# Patient Record
Sex: Male | Born: 1990 | Race: Black or African American | Hispanic: No | Marital: Single | Smoking: Current every day smoker
Health system: Southern US, Community
[De-identification: ages and names within clinical notes are randomized; demographics above are authoritative.]

---

## 2012-06-17 ENCOUNTER — Emergency Department (HOSPITAL_COMMUNITY)
Admission: EM | Admit: 2012-06-17 | Discharge: 2012-06-18 | Disposition: A | Discharge status: Home / Self Care | Payer: Self-pay | Attending: Emergency Medicine | Admitting: Emergency Medicine

## 2012-06-17 ENCOUNTER — Emergency Department (HOSPITAL_COMMUNITY): Payer: Self-pay

## 2012-06-17 ENCOUNTER — Emergency Department (HOSPITAL_COMMUNITY)
Admission: EM | Admit: 2012-06-17 | Discharge: 2012-06-17 | Disposition: A | Discharge status: Home / Self Care | Payer: Self-pay | Attending: Emergency Medicine | Admitting: Emergency Medicine

## 2012-06-17 ENCOUNTER — Encounter (HOSPITAL_COMMUNITY): Payer: Self-pay | Admitting: Adult Health

## 2012-06-17 DIAGNOSIS — N451 Epididymitis: Secondary | ICD-10-CM

## 2012-06-17 DIAGNOSIS — F1210 Cannabis abuse, uncomplicated: Secondary | ICD-10-CM | POA: Insufficient documentation

## 2012-06-17 DIAGNOSIS — R369 Urethral discharge, unspecified: Secondary | ICD-10-CM | POA: Insufficient documentation

## 2012-06-17 DIAGNOSIS — N50812 Left testicular pain: Secondary | ICD-10-CM

## 2012-06-17 DIAGNOSIS — Z7251 High risk heterosexual behavior: Secondary | ICD-10-CM | POA: Insufficient documentation

## 2012-06-17 DIAGNOSIS — N509 Disorder of male genital organs, unspecified: Secondary | ICD-10-CM | POA: Insufficient documentation

## 2012-06-17 DIAGNOSIS — N453 Epididymo-orchitis: Secondary | ICD-10-CM | POA: Insufficient documentation

## 2012-06-17 DIAGNOSIS — R350 Frequency of micturition: Secondary | ICD-10-CM | POA: Insufficient documentation

## 2012-06-17 LAB — CBC WITH DIFFERENTIAL/PLATELET
Basophils Relative: 0 % (ref 0–1)
Eosinophils Absolute: 0 10*3/uL (ref 0.0–0.7)
Eosinophils Relative: 1 % (ref 0–5)
HCT: 40.9 % (ref 39.0–52.0)
Hemoglobin: 14.1 g/dL (ref 13.0–17.0)
MCH: 28.9 pg (ref 26.0–34.0)
MCHC: 34.5 g/dL (ref 30.0–36.0)
MCV: 83.8 fL (ref 78.0–100.0)
Monocytes Absolute: 0.4 10*3/uL (ref 0.1–1.0)
Monocytes Relative: 6 % (ref 3–12)

## 2012-06-17 LAB — URINALYSIS, ROUTINE W REFLEX MICROSCOPIC
Bilirubin Urine: NEGATIVE
Glucose, UA: NEGATIVE mg/dL
Ketones, ur: 15 mg/dL — AB
pH: 6 (ref 5.0–8.0)

## 2012-06-17 LAB — COMPREHENSIVE METABOLIC PANEL
Albumin: 4.6 g/dL (ref 3.5–5.2)
BUN: 13 mg/dL (ref 6–23)
Calcium: 9.4 mg/dL (ref 8.4–10.5)
Chloride: 99 mEq/L (ref 96–112)
Creatinine, Ser: 1.11 mg/dL (ref 0.50–1.35)
Total Bilirubin: 0.3 mg/dL (ref 0.3–1.2)
Total Protein: 7.9 g/dL (ref 6.0–8.3)

## 2012-06-17 MED ORDER — ONDANSETRON HCL 4 MG/2ML IJ SOLN
4.0000 mg | Freq: Once | INTRAMUSCULAR | Status: AC
  Administered 2012-06-17: 4 mg via INTRAVENOUS
  Filled 2012-06-17: qty 2

## 2012-06-17 MED ORDER — HYDROCODONE-ACETAMINOPHEN 5-325 MG PO TABS: 2.0000 | ORAL_TABLET | ORAL | Status: AC | PRN

## 2012-06-17 MED ORDER — HYDROMORPHONE HCL PF 1 MG/ML IJ SOLN
1.0000 mg | Freq: Once | INTRAMUSCULAR | Status: DC
  Filled 2012-06-17: qty 1

## 2012-06-17 MED ORDER — ONDANSETRON 4 MG PO TBDP: 4.0000 mg | ORAL_TABLET | Freq: Once | ORAL | Status: DC

## 2012-06-17 MED ORDER — DEXTROSE 5 % IV SOLN
250.0000 mg | Freq: Once | INTRAVENOUS | Status: AC
  Administered 2012-06-17: 250 mg via INTRAVENOUS
  Filled 2012-06-17: qty 250

## 2012-06-17 MED ORDER — ONDANSETRON HCL 4 MG/2ML IJ SOLN: 4.0000 mg | Freq: Once | INTRAMUSCULAR | Status: DC

## 2012-06-17 MED ORDER — HYDROMORPHONE HCL PF 1 MG/ML IJ SOLN
1.0000 mg | Freq: Once | INTRAMUSCULAR | Status: AC
  Administered 2012-06-17: 1 mg via INTRAMUSCULAR

## 2012-06-17 MED ORDER — DOXYCYCLINE HYCLATE 100 MG PO CAPS: 100.0000 mg | ORAL_CAPSULE | Freq: Two times a day (BID) | ORAL | Status: AC

## 2012-06-17 NOTE — Progress Notes (Signed)
Patient ID: Paul Nguyen, male   DOB: 01-Dec-1990, 22 y.o.   MRN: [id number]   Mr. Cotroneo called me tonight. Evidently he was given our number for follow-up. He still has pain in the testicle and was wondering if he could have a "twist". I told him certainly intermittent torsion is a concern and I recommended he go to the ER for repeat scrotal US with doppler.

## 2012-06-17 NOTE — ED Provider Notes (Signed)
History     CSN: [id number]  Arrival date & time 06/17/12  0058   First MD Initiated Contact with Patient 06/17/12 0116      Chief Complaint  Patient presents with  . Testicle Pain    (Consider location/radiation/quality/duration/timing/severity/associated sxs/prior treatment) HPI Pt presents with L testicular pain 2 hours prior to presentation. He admits to unprotected sex and and penile d/c. Testicular pain is worse when standing. States he looked up testicular pain and is concerned for torsion. No previously similar symptoms. No fever, chills, flank pain. No trauma History reviewed. No pertinent past medical history.  History reviewed. No pertinent past surgical history.  History reviewed. No pertinent family history.  History  Substance Use Topics  . Smoking status: Never Smoker   . Smokeless tobacco: Not on file  . Alcohol Use: No      Review of Systems  Constitutional: Negative for fever and chills.  Gastrointestinal: Negative for nausea, vomiting and abdominal pain.  Genitourinary: Positive for frequency, discharge and testicular pain. Negative for dysuria, hematuria, flank pain, scrotal swelling and penile pain.  Musculoskeletal: Negative for back pain.  Skin: Negative for rash and wound.  Neurological: Negative for weakness and numbness.  All other systems reviewed and are negative.    Allergies  Review of patient's allergies indicates no known allergies.  Home Medications   Current Outpatient Rx  Name  Route  Sig  Dispense  Refill  . doxycycline (VIBRAMYCIN) 100 MG capsule   Oral   Take 1 capsule (100 mg total) by mouth 2 (two) times daily. One po bid x 7 days   14 capsule   0   . HYDROcodone-acetaminophen (NORCO) 5-325 MG per tablet   Oral   Take 2 tablets by mouth every 4 (four) hours as needed for pain.   10 tablet   0     BP 112/59  Pulse 79  Temp(Src) 98.9 F (37.2 C) (Oral)  Resp 16  SpO2 99%  Physical Exam  Nursing note and  vitals reviewed. Constitutional: He is oriented to person, place, and time. He appears well-developed and well-nourished.  Intermittently writhing on bed.   HENT:  Head: Normocephalic and atraumatic.  Mouth/Throat: Oropharynx is clear and moist.  Eyes: EOM are normal. Pupils are equal, round, and reactive to light.  Neck: Normal range of motion. Neck supple.  Cardiovascular: Normal rate and regular rhythm.   Pulmonary/Chest: Effort normal and breath sounds normal. No respiratory distress. He has no wheezes. He has no rales.  Abdominal: Soft. Bowel sounds are normal. He exhibits no distension and no mass. There is no tenderness. There is no rebound and no guarding.  Genitourinary: Penis normal. No penile tenderness.  bl testes in normal lie. +cremasteric reflex bl. Mild L testicular tenderness. No lymphadenopathy, hernia, penile d/c  Musculoskeletal: Normal range of motion. He exhibits no edema and no tenderness.  Neurological: He is alert and oriented to person, place, and time.  Skin: Skin is warm and dry. No rash noted. No erythema.  Psychiatric: He has a normal mood and affect. His behavior is normal.    ED Course  Procedures (including critical care time)  Labs Reviewed  URINALYSIS, ROUTINE W REFLEX MICROSCOPIC - Abnormal; Notable for the following:    Specific Gravity, Urine 1.034 (*)    Ketones, ur 15 (*)    All other components within normal limits  COMPREHENSIVE METABOLIC PANEL - Abnormal; Notable for the following:    Glucose, Bld 125 (*)  All other components within normal limits  GC/CHLAMYDIA PROBE AMP  CBC WITH DIFFERENTIAL   US Scrotum  06/17/2012  *RADIOLOGY REPORT*  Clinical Data: Left testicular pain  ULTRASOUND OF SCROTUM  Technique:  Complete ultrasound examination of the testicles, epididymis, and other scrotal structures was performed.  Comparison:  None.  Findings:  Right testis:  Measures 4.7 x 2.2 x 2.8 cm.  Normal echogenicity.  Left testis:  Measures 4.7 x  2.1 x 2.5 cm.  Normal echogenicity.  Right epididymis:  Normal in size and appearance.  Left epididymis:  Normal in size and mildly heterogeneous echogenicity.  Hydrocele:  Absent  Varicocele:  Absent  Color Doppler flow with arterial and venous wave forms documented bilaterally.  IMPRESSION: Normal sonographic appearance to the testicles.  Color Doppler flow with arterial and venous wave forms documented bilaterally.  Nonspecific mild heterogeneous echogenicity to the left epididymis without hypervascularity.  May reflect a mild or sequelae of prior epididymitis.   Original Report Authenticated By: Jearld Lesch, M.D.    [place] Art/ven Flow Abd Pelv Doppler  06/17/2012  *RADIOLOGY REPORT*  Clinical Data: Left testicular pain  ULTRASOUND OF SCROTUM  Technique:  Complete ultrasound examination of the testicles, epididymis, and other scrotal structures was performed.  Comparison:  None.  Findings:  Right testis:  Measures 4.7 x 2.2 x 2.8 cm.  Normal echogenicity.  Left testis:  Measures 4.7 x 2.1 x 2.5 cm.  Normal echogenicity.  Right epididymis:  Normal in size and appearance.  Left epididymis:  Normal in size and mildly heterogeneous echogenicity.  Hydrocele:  Absent  Varicocele:  Absent  Color Doppler flow with arterial and venous wave forms documented bilaterally.  IMPRESSION: Normal sonographic appearance to the testicles.  Color Doppler flow with arterial and venous wave forms documented bilaterally.  Nonspecific mild heterogeneous echogenicity to the left epididymis without hypervascularity.  May reflect a mild or sequelae of prior epididymitis.   Original Report Authenticated By: Jearld Lesch, M.D.      1. Testicular pain, left       MDM  Will treat for epididymitis. Encourage to f/u with urology and return for worsening symptoms.         Loren Racer, MD 06/17/12 [phone]

## 2012-06-17 NOTE — ED Notes (Signed)
Presents with sudden onset of  Left sided testicle pain while sitting in car, pain radiates into left thigh and into lower left abdomen. Pt reports sexual intercourse yesterday without a condom and urination more frequently over the past 2 weeks. Pain is described as "hot and heat" associated with discharge.

## 2012-06-17 NOTE — ED Notes (Signed)
Pt seen here yesterday for same symptoms, c/o of worsening pain.

## 2012-06-17 NOTE — ED Notes (Signed)
Pt. Up to nurses desk crying stating "the pain is getting worse".

## 2012-06-18 LAB — GC/CHLAMYDIA PROBE AMP
CT Probe RNA: NEGATIVE
GC Probe RNA: NEGATIVE

## 2012-06-18 MED ORDER — NAPROXEN 250 MG PO TABS
500.0000 mg | ORAL_TABLET | Freq: Once | ORAL | Status: AC
  Administered 2012-06-18: 500 mg via ORAL
  Filled 2012-06-18: qty 2

## 2012-06-18 MED ORDER — NAPROXEN 375 MG PO TABS: 375.0000 mg | ORAL_TABLET | Freq: Two times a day (BID) | ORAL | Status: DC

## 2012-06-18 NOTE — ED Provider Notes (Signed)
Medical screening examination/treatment/procedure(s) were performed by non-physician practitioner and as supervising physician I was immediately available for consultation/collaboration.  Ethelda Chick, MD 06/18/12 [phone]

## 2012-06-18 NOTE — Discharge Instructions (Signed)
Epididymitis Epididymitis is a swelling (inflammation) of the epididymis. The epididymis is a cord-like structure along the back part of the testicle. Epididymitis is usually, but not always, caused by infection. This is usually a sudden problem beginning with chills, fever and pain behind the scrotum and in the testicle. There may be swelling and redness of the testicle. DIAGNOSIS  Physical examination will reveal a tender, swollen epididymis. Sometimes, cultures are obtained from the urine or from prostate secretions to help find out if there is an infection or if the cause is a different problem. Sometimes, blood work is performed to see if your white blood cell count is elevated and if a germ (bacterial) or viral infection is present. Using this knowledge, an appropriate medicine which kills germs (antibiotic) can be chosen by your caregiver. A viral infection causing epididymitis will most often go away (resolve) without treatment. HOME CARE INSTRUCTIONS   Hot sitz baths for 20 minutes, 4 times per day, may help relieve pain.  Only take over-the-counter or prescription medicines for pain, discomfort or fever as directed by your caregiver.  Take all medicines, including antibiotics, as directed. Take the antibiotics for the full prescribed length of time even if you are feeling better.  It is very important to keep all follow-up appointments. SEEK IMMEDIATE MEDICAL CARE IF:   You have a fever.  You have pain not relieved with medicines.  You have any worsening of your problems.  Your pain seems to come and go.  You develop pain, redness, and swelling in the scrotum and surrounding areas. MAKE SURE YOU:   Understand these instructions.  Will watch your condition.  Will get help right away if you are not doing well or get worse. Document Released: 04/01/2000 Document Revised: 06/27/2011 Document Reviewed: 02/19/2009 [employer] Patient Information 2013 [employer].  

## 2012-06-18 NOTE — ED Provider Notes (Signed)
History     CSN: [id number]  Arrival date & time 06/17/12  2118   First MD Initiated Contact with Patient 06/17/12 2352      Chief Complaint  Patient presents with  . Groin Pain    (Consider location/radiation/quality/duration/timing/severity/associated sxs/prior treatment) HPI  22 year old male presents complaining of testicle pain. Patient reports acute onset of pain to his left testicle yesterday while sitting in the car. Describe pain as a sharp and throbbing sensation worsening with certain position. He also noticed swelling to his left scrotum. Pain is worse when standing. He denies fever chills, back pain, flank pain, abdominal pain. He denies nausea, vomiting, diarrhea. He denies any rash. He denies pain with sexual activities. He was seen in the ED yesterday for the same complaint. At which time a scrotal ultrasound was performed demonstrating no evidence of testicular torsion. There is some mild evidence suggestive of epididymitis. No evidence of urinary tract infection based on UA. Patient was discharge with antibiotic, and Vicodin for pain. Patient reports he has been taking the pain medication but pain has not resolved. He did call urologist, Dr. Carolin Sicks, who recommend patient to take and nonsteroidal anti-inflammatory medication for this pain. He decided to come to the ER instead.  No past medical history on file.  No past surgical history on file.  No family history on file.  History  Substance Use Topics  . Smoking status: Never Smoker   . Smokeless tobacco: Not on file  . Alcohol Use: No      Review of Systems  Constitutional: Negative for fever.  Genitourinary: Positive for scrotal swelling and testicular pain. Negative for discharge, penile swelling and penile pain.  Skin: Negative for rash.  Neurological: Negative for numbness.    Allergies  Review of patient's allergies indicates no known allergies.  Home Medications   Current Outpatient Rx  Name   Route  Sig  Dispense  Refill  . doxycycline (VIBRAMYCIN) 100 MG capsule   Oral   Take 1 capsule (100 mg total) by mouth 2 (two) times daily. One po bid x 7 days   14 capsule   0   . HYDROcodone-acetaminophen (NORCO) 5-325 MG per tablet   Oral   Take 2 tablets by mouth every 4 (four) hours as needed for pain.   10 tablet   0     BP 118/79  Pulse 67  Temp(Src) 98.2 F (36.8 C) (Oral)  Resp 18  SpO2 100%  Physical Exam  Nursing note and vitals reviewed. Constitutional: He appears well-developed and well-nourished. No distress.  HENT:  Head: Atraumatic.  Eyes: Conjunctivae are normal.  Neck: Neck supple.  Abdominal: Soft. There is no tenderness.  No CVA tenderness.  Genitourinary: Penis normal. No penile tenderness.  Mild tenderness to L testicle without overlying skin changes, or rash noted.  Normal lie.  +cremasteric reflex  Musculoskeletal: He exhibits no edema and no tenderness.  Neurological: He is alert.  Skin: Skin is warm. No rash noted.  Psychiatric: He has a normal mood and affect.    ED Course  Procedures (including critical care time)  Labs Reviewed - No data to display US Scrotum  06/17/2012  *RADIOLOGY REPORT*  Clinical Data: Left testicular pain  ULTRASOUND OF SCROTUM  Technique:  Complete ultrasound examination of the testicles, epididymis, and other scrotal structures was performed.  Comparison:  None.  Findings:  Right testis:  Measures 4.7 x 2.2 x 2.8 cm.  Normal echogenicity.  Left testis:  Measures  4.7 x 2.1 x 2.5 cm.  Normal echogenicity.  Right epididymis:  Normal in size and appearance.  Left epididymis:  Normal in size and mildly heterogeneous echogenicity.  Hydrocele:  Absent  Varicocele:  Absent  Color Doppler flow with arterial and venous wave forms documented bilaterally.  IMPRESSION: Normal sonographic appearance to the testicles.  Color Doppler flow with arterial and venous wave forms documented bilaterally.  Nonspecific mild heterogeneous  echogenicity to the left epididymis without hypervascularity.  May reflect a mild or sequelae of prior epididymitis.   Original Report Authenticated By: Jearld Lesch, M.D.    [place] Art/ven Flow Abd Pelv Doppler  06/17/2012  *RADIOLOGY REPORT*  Clinical Data: Left testicular pain  ULTRASOUND OF SCROTUM  Technique:  Complete ultrasound examination of the testicles, epididymis, and other scrotal structures was performed.  Comparison:  None.  Findings:  Right testis:  Measures 4.7 x 2.2 x 2.8 cm.  Normal echogenicity.  Left testis:  Measures 4.7 x 2.1 x 2.5 cm.  Normal echogenicity.  Right epididymis:  Normal in size and appearance.  Left epididymis:  Normal in size and mildly heterogeneous echogenicity.  Hydrocele:  Absent  Varicocele:  Absent  Color Doppler flow with arterial and venous wave forms documented bilaterally.  IMPRESSION: Normal sonographic appearance to the testicles.  Color Doppler flow with arterial and venous wave forms documented bilaterally.  Nonspecific mild heterogeneous echogenicity to the left epididymis without hypervascularity.  May reflect a mild or sequelae of prior epididymitis.   Original Report Authenticated By: Jearld Lesch, M.D.      No diagnosis found.  12:25 AM Pt was seen and evaluated by me for his testicle pain and recent diagnosis of epidydimitis.  He is here primarily requesting NSAIDs since he cannot tolerates vicodin.  Pain improves after receiving naproxen.  Pt stable for discharge.  Previous US shows no evidence concerning for testicular torsion or UTI.  Pt to f/u with urologist as needed.  All questions answered to pt's satisfaction.  Return precaution discussed.    I have reviewed nursing notes and vital signs. I personally reviewed the imaging tests through PACS system  I reviewed available ER/hospitalization records thought the EMR  BP 118/79  Pulse 67  Temp(Src) 98.2 F (36.8 C) (Oral)  Resp 18  SpO2 100%   1. epidydimitis  MDM           Fayrene Helper, PA-C 06/18/12 [id number]

## 2014-04-16 ENCOUNTER — Emergency Department (HOSPITAL_COMMUNITY)
Admission: EM | Admit: 2014-04-16 | Discharge: 2014-04-16 | Disposition: A | Discharge status: Home / Self Care | Payer: BC Managed Care – PPO | Attending: Emergency Medicine | Admitting: Emergency Medicine

## 2014-04-16 ENCOUNTER — Encounter (HOSPITAL_COMMUNITY): Payer: Self-pay | Admitting: Nurse Practitioner

## 2014-04-16 DIAGNOSIS — Z720 Tobacco use: Secondary | ICD-10-CM | POA: Insufficient documentation

## 2014-04-16 DIAGNOSIS — H9209 Otalgia, unspecified ear: Secondary | ICD-10-CM | POA: Insufficient documentation

## 2014-04-16 DIAGNOSIS — B349 Viral infection, unspecified: Secondary | ICD-10-CM | POA: Insufficient documentation

## 2014-04-16 DIAGNOSIS — Z79899 Other long term (current) drug therapy: Secondary | ICD-10-CM | POA: Diagnosis not present

## 2014-04-16 DIAGNOSIS — J029 Acute pharyngitis, unspecified: Secondary | ICD-10-CM | POA: Insufficient documentation

## 2014-04-16 LAB — RAPID STREP SCREEN (MED CTR MEBANE ONLY): STREPTOCOCCUS, GROUP A SCREEN (DIRECT): NEGATIVE

## 2014-04-16 MED ORDER — NAPROXEN 500 MG PO TABS: 500.0000 mg | ORAL_TABLET | Freq: Two times a day (BID) | ORAL | Status: AC

## 2014-04-16 NOTE — ED Notes (Signed)
Pt c/o of a sore throat for the last one week, denies fever or chills, or difficulty swallowing.

## 2014-04-16 NOTE — ED Provider Notes (Signed)
CSN: [id number]     Arrival date & time 04/16/14  2009 History  This chart was scribed for non-physician practitioner, Antony MaduraKelly Ily Denno, PA-C,working with Arby BarretteMarcy Pfeiffer, MD, by Karle PlumberJennifer Tensley, ED Scribe. This patient was seen in room WTR9/WTR9 and the patient's care was started at 9:12 PM.  Chief Complaint  Patient presents with  . Sore Throat   The history is provided by the patient. No language interpreter was used.    HPI Comments:  Paul Nguyen is a 23 y.o. male who presents to the Emergency Department complaining of unchanging, moderate right-sided throat pain that began approximately one week ago. He reports associated dry throat, cough that also started about one week ago, chest congestion and also mild right ear pain. States the pain is worse in the morning. He reports taking OTC Tylenol and drinking hot tea with significant relief. Denies worsening factors. Denies inability to swallow, fever, chills, nasal congestion, rhinorrhea, drooling, pus drainage in the mouth, nausea or vomiting.  History reviewed. No pertinent past medical history. History reviewed. No pertinent past surgical history. History reviewed. No pertinent family history. History  Substance Use Topics  . Smoking status: Current Every Day Smoker -- 3.00 packs/day    Types: Cigarettes  . Smokeless tobacco: Not on file  . Alcohol Use: No    Review of Systems  Constitutional: Negative for fever and chills.  HENT: Positive for congestion, ear pain and sore throat. Negative for drooling, rhinorrhea and trouble swallowing.   Respiratory: Positive for cough. Negative for shortness of breath.   Gastrointestinal: Negative for nausea and vomiting.    Allergies  Review of patient's allergies indicates no known allergies.  Home Medications   Prior to Admission medications   Medication Sig Start Date End Date Taking? Authorizing Provider  doxycycline (VIBRAMYCIN) 100 MG capsule Take 1 capsule (100 mg total) by mouth 2  (two) times daily. One po bid x 7 days 06/17/12   Loren Raceravid Yelverton, MD  HYDROcodone-acetaminophen [facility](NORCO) 5-325 MG per tablet Take 2 tablets by mouth every 4 (four) hours as needed for pain. 06/17/12   Loren Raceravid Yelverton, MD  naproxen (NAPROSYN) 500 MG tablet Take 1 tablet (500 mg total) by mouth 2 (two) times daily. 04/16/14   Antony MaduraKelly Arnett Duddy, PA-C   Triage Vitals: BP 127/84 mmHg  Pulse 73  Temp(Src) 98.6 F (37 C) (Oral)  Resp 18  SpO2 100%  Physical Exam  Constitutional: He is oriented to person, place, and time. He appears well-developed and well-nourished. No distress.  Nontoxic/nonseptic appearing  HENT:  Head: Normocephalic and atraumatic.  Right Ear: Tympanic membrane, external ear and ear canal normal.  Left Ear: Tympanic membrane, external ear and ear canal normal.  Mouth/Throat: Uvula is midline and mucous membranes are normal. No oral lesions. No trismus in the jaw. No uvula swelling. Posterior oropharyngeal edema (Very mild) and posterior oropharyngeal erythema present. No oropharyngeal exudate or tonsillar abscesses.  Patient tolerating secretions without difficulty and speaking in full sentences.  Eyes: Conjunctivae and EOM are normal. No scleral icterus.  Neck: Normal range of motion.  No nuchal rigidity or meningismus. No stridor.  Cardiovascular: Normal rate, regular rhythm and normal heart sounds.   Pulmonary/Chest: Effort normal and breath sounds normal. No respiratory distress. He has no wheezes. He has no rales.  Respirations even and unlabored  Musculoskeletal: Normal range of motion.  Lymphadenopathy:    He has cervical adenopathy (tender R anterior cervical).  Neurological: He is alert and oriented to person, place, and time. He  exhibits normal muscle tone. Coordination normal.  Skin: Skin is warm and dry. No rash noted. He is not diaphoretic. No erythema. No pallor.  Psychiatric: He has a normal mood and affect. His behavior is normal.  Nursing note and vitals  reviewed.   ED Course  Procedures (including critical care time) DIAGNOSTIC STUDIES: Oxygen Saturation is 100% on RA, normal by my interpretation.   COORDINATION OF CARE: 9:22 PM- Reassured pt that his symptoms were likely viral. Will treat symptomatically. Pt verbalizes understanding and agrees to plan.  Medications - No data to display  Labs Review Labs Reviewed  RAPID STREP SCREEN  CULTURE, GROUP A STREP   Imaging Review No results found.   EKG Interpretation None      MDM   Final diagnoses:  Viral pharyngitis  Viral illness    Pt afebrile without tonsillar exudate, negative strep. Presents with mild cervical lymphadenopathy, and dysphagia; diagnosis of viral pharyngitis. No abx indicated. Will discharge with symptomatic tx for pain  Pt does not appear dehydrated, but did discuss importance of water rehydration. Presentation not concerning for PTA or infxn spread to soft tissue. No trismus or uvula deviation. Specific return precautions discussed. Pt tolerating secretions without difficulty; airway intact. Recommended PCP follow up. Patient agreeable to plan with no unaddressed concerns.  I personally performed the services described in this documentation, which was scribed in my presence. The recorded information has been reviewed and is accurate.   Filed Vitals:   04/16/14 2028  BP: 127/84  Pulse: 73  Temp: 98.6 F (37 C)  TempSrc: Oral  Resp: 18  SpO2: 100%     Antony MaduraKelly Shereese Bonnie, PA-C 04/16/14 2225  Arby BarretteMarcy Pfeiffer, MD 04/17/14 [phone]

## 2014-04-16 NOTE — Discharge Instructions (Signed)
Viral Infections A viral infection can be caused by different types of viruses.Most viral infections are not serious and resolve on their own. However, some infections may cause severe symptoms and may lead to further complications. SYMPTOMS Viruses can frequently cause:  Minor sore throat.  Aches and pains.  Headaches.  Runny nose.  Different types of rashes.  Watery eyes.  Tiredness.  Cough.  Loss of appetite.  Gastrointestinal infections, resulting in nausea, vomiting, and diarrhea. These symptoms do not respond to antibiotics because the infection is not caused by bacteria. However, you might catch a bacterial infection following the viral infection. This is sometimes called a "superinfection." Symptoms of such a bacterial infection may include:  Worsening sore throat with pus and difficulty swallowing.  Swollen neck glands.  Chills and a high or persistent fever.  Severe headache.  Tenderness over the sinuses.  Persistent overall ill feeling (malaise), muscle aches, and tiredness (fatigue).  Persistent cough.  Yellow, green, or brown mucus production with coughing. HOME CARE INSTRUCTIONS   Only take over-the-counter or prescription medicines for pain, discomfort, diarrhea, or fever as directed by your caregiver.  Drink enough water and fluids to keep your urine clear or pale yellow. Sports drinks can provide valuable electrolytes, sugars, and hydration.  Get plenty of rest and maintain proper nutrition. Soups and broths with crackers or rice are fine. SEEK IMMEDIATE MEDICAL CARE IF:   You have severe headaches, shortness of breath, chest pain, neck pain, or an unusual rash.  You have uncontrolled vomiting, diarrhea, or you are unable to keep down fluids.  You or your child has an oral temperature above 102 F (38.9 C), not controlled by medicine.  Your baby is older than 3 months with a rectal temperature of 102 F (38.9 C) or higher.  Your baby is 463  months old or younger with a rectal temperature of 100.4 F (38 C) or higher. MAKE SURE YOU:   Understand these instructions.  Will watch your condition.  Will get help right away if you are not doing well or get worse. Document Released: 01/12/2005 Document Revised: 06/27/2011 Document Reviewed: 08/09/2010 [facility] LPExitCare Patient Information 2015 [employer], [place]. This information is not intended to replace advice given to you by your health care provider. Make sure you discuss any questions you have with your health care provider. Pharyngitis Pharyngitis is redness, pain, and swelling (inflammation) of your pharynx.  CAUSES  Pharyngitis is usually caused by infection. Most of the time, these infections are from viruses (viral) and are part of a cold. However, sometimes pharyngitis is caused by bacteria (bacterial). Pharyngitis can also be caused by allergies. Viral pharyngitis may be spread from person to person by coughing, sneezing, and personal items or utensils (cups, forks, spoons, toothbrushes). Bacterial pharyngitis may be spread from person to person by more intimate contact, such as kissing.  SIGNS AND SYMPTOMS  Symptoms of pharyngitis include:   Sore throat.   Tiredness (fatigue).   Low-grade fever.   Headache.  Joint pain and muscle aches.  Skin rashes.  Swollen lymph nodes.  Plaque-like film on throat or tonsils (often seen with bacterial pharyngitis). DIAGNOSIS  Your health care provider will ask you questions about your illness and your symptoms. Your medical history, along with a physical exam, is often all that is needed to diagnose pharyngitis. Sometimes, a rapid strep test is done. Other lab tests may also be done, depending on the suspected cause.  TREATMENT  Viral pharyngitis will usually get better in 3-4  days without the use of medicine. Bacterial pharyngitis is treated with medicines that kill germs (antibiotics).  HOME CARE INSTRUCTIONS   Drink enough  water and fluids to keep your urine clear or pale yellow.   Only take over-the-counter or prescription medicines as directed by your health care provider:   If you are prescribed antibiotics, make sure you finish them even if you start to feel better.   Do not take aspirin.   Get lots of rest.   Gargle with 8 oz of salt water ( tsp of salt per 1 qt of water) as often as every 1-2 hours to soothe your throat.   Throat lozenges (if you are not at risk for choking) or sprays may be used to soothe your throat. SEEK MEDICAL CARE IF:   You have large, tender lumps in your neck.  You have a rash.  You cough up green, yellow-brown, or bloody spit. SEEK IMMEDIATE MEDICAL CARE IF:   Your neck becomes stiff.  You drool or are unable to swallow liquids.  You vomit or are unable to keep medicines or liquids down.  You have severe pain that does not go away with the use of recommended medicines.  You have trouble breathing (not caused by a stuffy nose). MAKE SURE YOU:   Understand these instructions.  Will watch your condition.  Will get help right away if you are not doing well or get worse. Document Released: 04/04/2005 Document Revised: 01/23/2013 Document Reviewed: 12/10/2012 [facility]onaExitCare Patient Information 2015 [place], [place]. This information is not intended to replace advice given to you by your health care provider. Make sure you discuss any questions you have with your health care provider. Salt Water Gargle This solution will help make your mouth and throat feel better. HOME CARE INSTRUCTIONS   Mix 1 teaspoon of salt in 8 ounces of warm water.  Gargle with this solution as much or often as you need or as directed. Swish and gargle gently if you have any sores or wounds in your mouth.  Do not swallow this mixture. Document Released: 01/07/2004 Document Revised: 06/27/2011 Document Reviewed: 05/30/2008 [facility]alaExitCare Patient Information 2015 [place], [place]. This  information is not intended to replace advice given to you by your health care provider. Make sure you discuss any questions you have with your health care provider.

## 2014-04-20 LAB — CULTURE, GROUP A STREP

## 2014-04-23 IMAGING — US US SCROTUM
1 series · 14 of 25 positions shown · non-contrast
Comparison: None.

CLINICAL DATA: Left testicular pain

ULTRASOUND OF SCROTUM
TECHNIQUE: Complete ultrasound examination of the testicles,
epididymis, and other scrotal structures was performed.

[Series 1: us scrotum · 0.07mm/px · 14 of 45 slices shown]
[im 1/45]
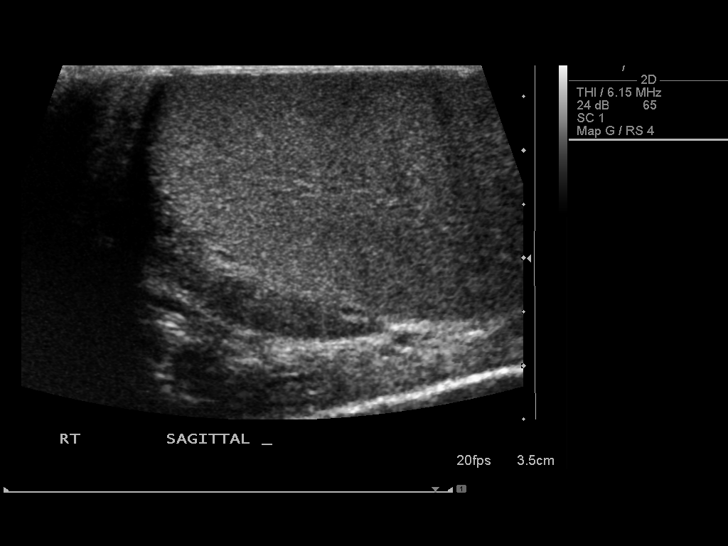
[im 4/45]
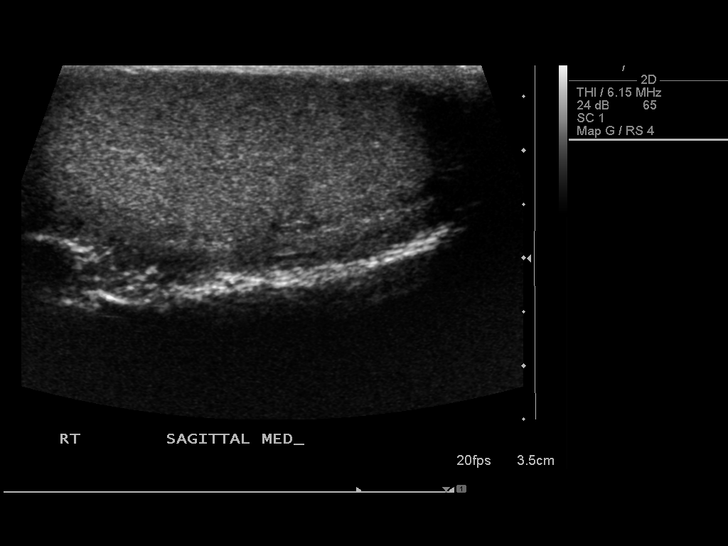
[im 8/45]
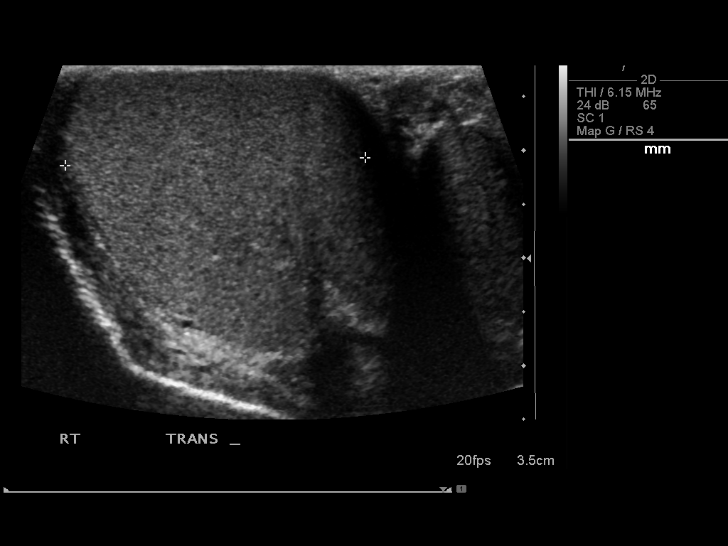
[im 12/45]
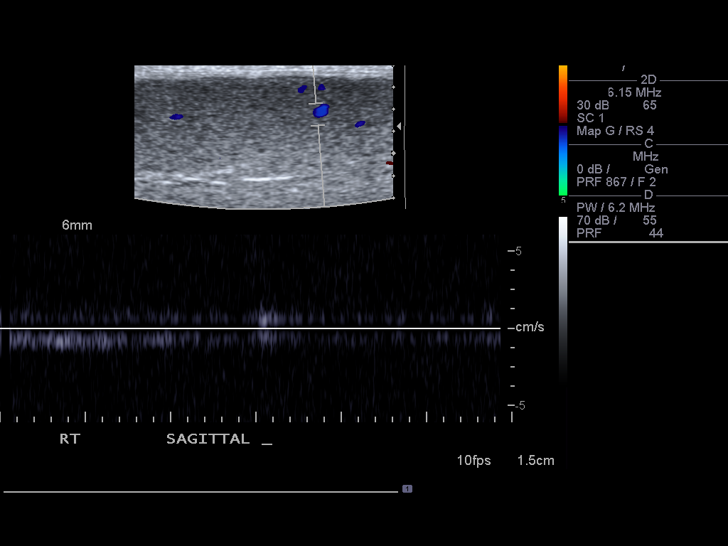
[im 15/45]
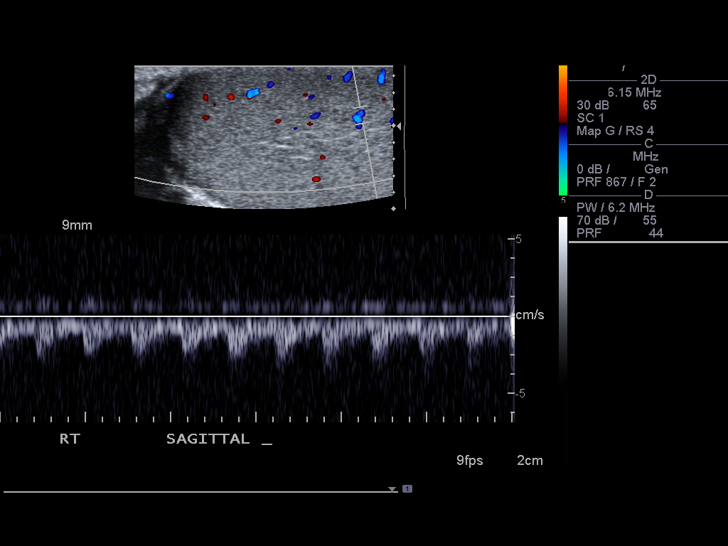
[im 17/45]
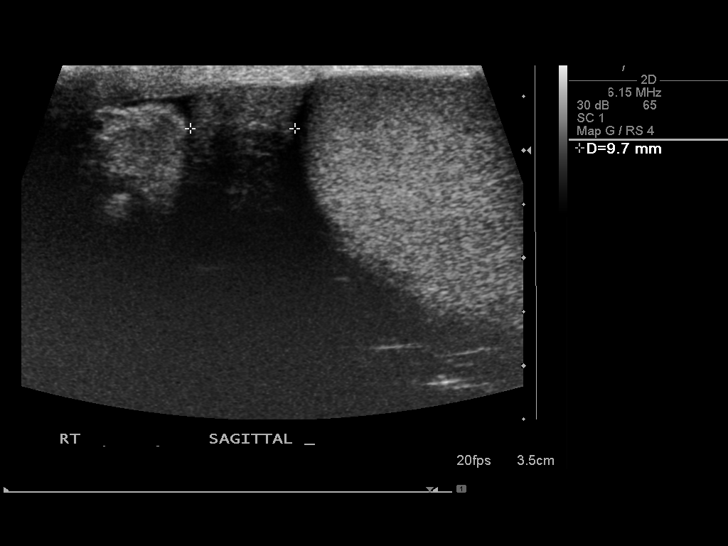
[im 21/45]
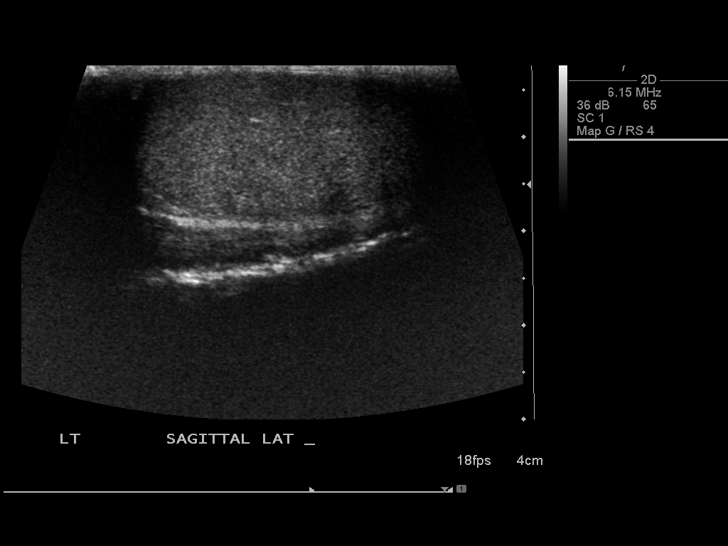
[im 24/45]
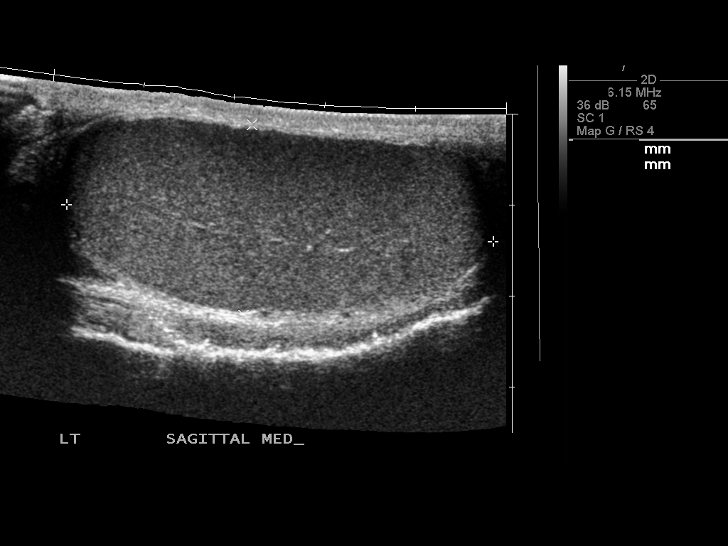
[im 28/45]
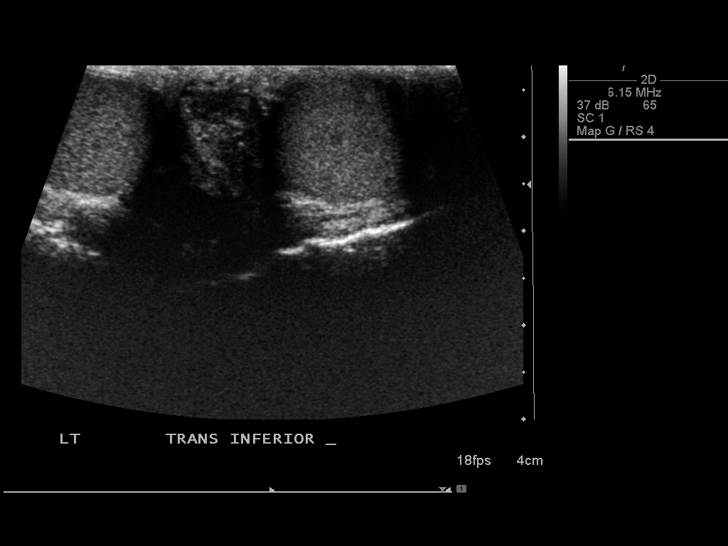
[im 30/45]
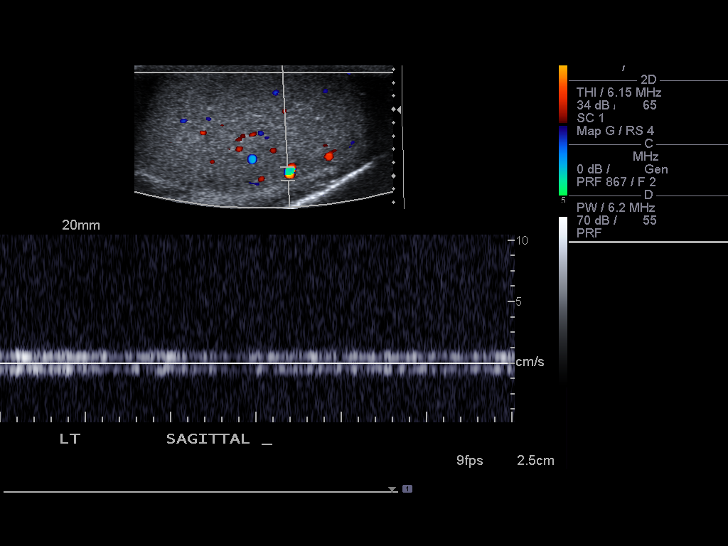
[im 34/45]
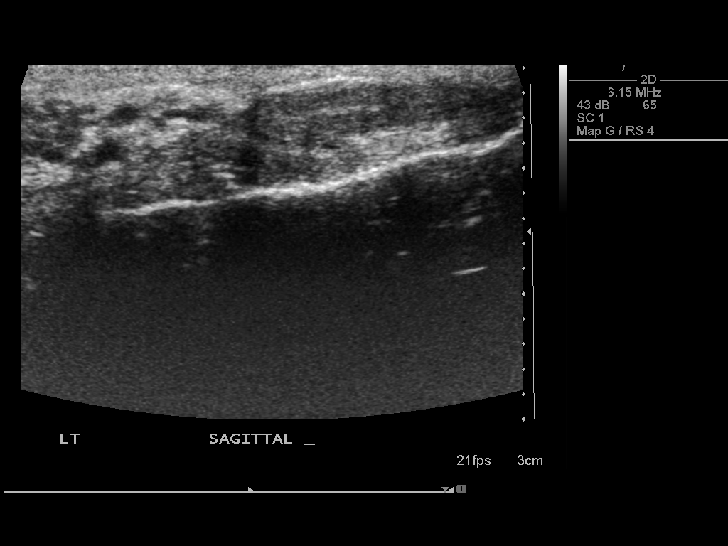
[im 37/45]
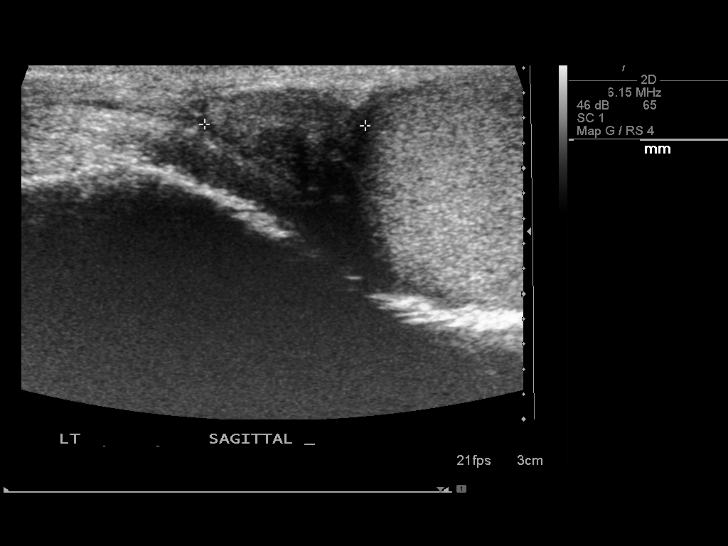
[im 41/45]
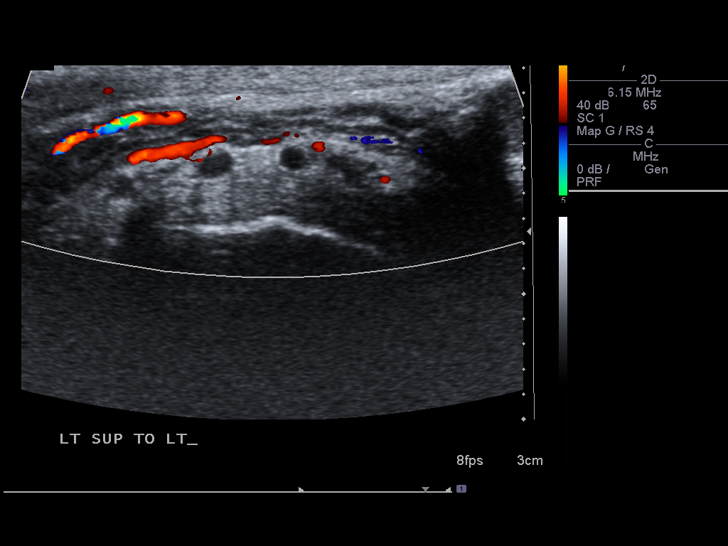
[im 45/45]
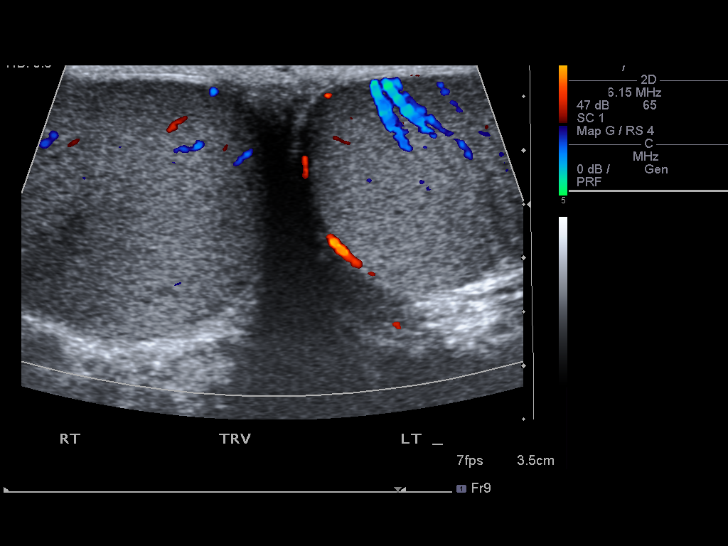

[14 of 25 positions shown; findings below may reference images not displayed]

FINDINGS: Right testis:  Measures 4.7 x 2.2 x 2.8 cm.  Normal echogenicity.

Left testis:  Measures 4.7 x 2.1 x 2.5 cm.  Normal echogenicity.

Right epididymis:  Normal in size and appearance.

Left epididymis:  Normal in size and mildly heterogeneous
echogenicity.

Hydrocele:  Absent

Varicocele:  Absent

Color Doppler flow with arterial and venous wave forms documented
bilaterally.
IMPRESSION: Normal sonographic appearance to the testicles.

Color Doppler flow with arterial and venous wave forms documented
bilaterally.

Nonspecific mild heterogeneous echogenicity to the left epididymis
without hypervascularity.  May reflect a mild or sequelae of prior
epididymitis.

## 2023-11-18 ENCOUNTER — Ambulatory Visit: Payer: Self-pay | Admitting: Family Medicine

## 2023-11-18 VITALS — BP 114/70 | HR 84 | Temp 98.3°F | Resp 14

## 2023-11-18 DIAGNOSIS — Z719 Counseling, unspecified: Secondary | ICD-10-CM

## 2023-11-27 ENCOUNTER — Encounter: Payer: Self-pay | Admitting: Family Medicine

## 2023-11-27 NOTE — Progress Notes (Signed)
 Nurse visit cosign

## 2024-02-03 ENCOUNTER — Other Ambulatory Visit: Payer: Self-pay

## 2024-02-03 ENCOUNTER — Emergency Department (HOSPITAL_COMMUNITY)
Admission: EM | Admit: 2024-02-03 | Discharge: 2024-02-04 | Disposition: A | Discharge status: Home / Self Care | Payer: Self-pay | Attending: Emergency Medicine | Admitting: Emergency Medicine

## 2024-02-03 ENCOUNTER — Encounter (HOSPITAL_COMMUNITY): Payer: Self-pay

## 2024-02-03 ENCOUNTER — Emergency Department (HOSPITAL_COMMUNITY): Payer: Self-pay

## 2024-02-03 DIAGNOSIS — R079 Chest pain, unspecified: Secondary | ICD-10-CM

## 2024-02-03 DIAGNOSIS — R000 Tachycardia, unspecified: Secondary | ICD-10-CM | POA: Insufficient documentation

## 2024-02-03 DIAGNOSIS — F17200 Nicotine dependence, unspecified, uncomplicated: Secondary | ICD-10-CM | POA: Insufficient documentation

## 2024-02-03 DIAGNOSIS — R0789 Other chest pain: Secondary | ICD-10-CM | POA: Insufficient documentation

## 2024-02-03 DIAGNOSIS — F419 Anxiety disorder, unspecified: Secondary | ICD-10-CM | POA: Insufficient documentation

## 2024-02-03 LAB — TROPONIN T, HIGH SENSITIVITY: Troponin T High Sensitivity: <15 ng/L (ref 0–19)

## 2024-02-03 LAB — BASIC METABOLIC PANEL WITH GFR
Anion gap: 12 (ref 5–15)
BUN: 14 mg/dL (ref 6–20)
CO2: 24 mmol/L (ref 22–32)
Calcium: 9.3 mg/dL (ref 8.9–10.3)
Chloride: 102 mmol/L (ref 98–111)
Creatinine, Ser: 0.97 mg/dL (ref 0.61–1.24)
GFR, Estimated: >60 mL/min (ref >60)
Glucose, Bld: 123 mg/dL — ABNORMAL HIGH (ref 70–99)
Potassium: 3.8 mmol/L (ref 3.5–5.1)
Sodium: 138 mmol/L (ref 135–145)

## 2024-02-03 LAB — CBC
HCT: 46 % (ref 39.0–52.0)
Hemoglobin: 14.5 g/dL (ref 13.0–17.0)
MCH: 27.6 pg (ref 26.0–34.0)
MCHC: 31.5 g/dL (ref 30.0–36.0)
MCV: 87.5 fL (ref 80.0–100.0)
Platelets: 235 K/uL (ref 150–400)
RBC: 5.26 MIL/uL (ref 4.22–5.81)
RDW: 14.2 % (ref 11.5–15.5)
WBC: 9 K/uL (ref 4.0–10.5)
nRBC: 0 % (ref 0.0–0.2)

## 2024-02-03 MED ORDER — HYDROXYZINE HCL 25 MG PO TABS
25.0000 mg | ORAL_TABLET | Freq: Once | ORAL | Status: AC
  Administered 2024-02-03: 25 mg via ORAL
  Filled 2024-02-03: qty 1

## 2024-02-03 NOTE — ED Triage Notes (Signed)
 Pt presents via POV c/o chest pain and heart palpitations over the last few hours. Denies cardiac hx.

## 2024-02-03 NOTE — ED Notes (Signed)
 Urine sample sent to lab

## 2024-02-04 MED ORDER — HYDROXYZINE HCL 25 MG PO TABS: 25.0000 mg | ORAL_TABLET | Freq: Four times a day (QID) | ORAL | 0 refills | Status: AC | PRN

## 2024-02-04 NOTE — ED Provider Notes (Signed)
 [place] EMERGENCY DEPARTMENT AT [facility] Provider Note   CSN: [id number] Arrival date & time: 02/03/24  2152     Patient presents with: Chest Pain   Paul Nguyen is a 33 y.o. male.  Patient with no relevant past medical history on file presents to the emergency room complaining of chest discomfort with heart palpitations which began a few hours prior to arrival.  He appears anxious at the time of my assessment and states that he has been having increase stressors at home.  He does endorse a history of anxiety.  The patient denies shortness of breath, abdominal pain, nausea, vomiting, radiation of symptoms.  He has no recent travel or surgery.    Chest Pain      Prior to Admission medications   Medication Sig Start Date End Date Taking? Authorizing Provider  hydrOXYzine (ATARAX) 25 MG tablet Take 1 tablet (25 mg total) by mouth every 6 (six) hours as needed for anxiety. 02/04/24  Yes Logan Ubaldo NOVAK, PA-C  doxycycline  (VIBRAMYCIN ) 100 MG capsule Take 1 capsule (100 mg total) by mouth 2 (two) times daily. One po bid x 7 days 06/17/12   Carlyle Lenis, MD  HYDROcodone -acetaminophen  (NORCO) 5-325 MG per tablet Take 2 tablets by mouth every 4 (four) hours as needed for pain. 06/17/12   Carlyle Lenis, MD  naproxen  (NAPROSYN ) 500 MG tablet Take 1 tablet (500 mg total) by mouth 2 (two) times daily. 04/16/14   Keith Sor, PA-C    Allergies: Patient has no known allergies.    Review of Systems  Cardiovascular:  Positive for chest pain.    Updated Vital Signs BP 113/79   Pulse 71   Temp 98.4 F (36.9 C)   Resp 18   Ht 5' 11 (1.803 m)   Wt 83.9 kg   SpO2 97%   BMI 25.80 kg/m   Physical Exam Vitals and nursing note reviewed.  Constitutional:      General: He is not in acute distress.    Appearance: He is well-developed.  HENT:     Head: Normocephalic and atraumatic.  Eyes:     Conjunctiva/sclera: Conjunctivae normal.  Cardiovascular:     Rate and  Rhythm: Normal rate and regular rhythm.     Heart sounds: No murmur heard.    Comments: Patient initially tachycardic with significant improvement after Atarax Pulmonary:     Effort: Pulmonary effort is normal. No respiratory distress.     Breath sounds: Normal breath sounds.  Abdominal:     Palpations: Abdomen is soft.     Tenderness: There is no abdominal tenderness.  Musculoskeletal:        General: No swelling.     Cervical back: Neck supple.  Skin:    General: Skin is warm and dry.     Capillary Refill: Capillary refill takes less than 2 seconds.  Neurological:     Mental Status: He is alert.  Psychiatric:        Mood and Affect: Mood normal.     (all labs ordered are listed, but only abnormal results are displayed) Labs Reviewed  BASIC METABOLIC PANEL WITH GFR - Abnormal; Notable for the following components:      Result Value   Glucose, Bld 123 (*)    All other components within normal limits  CBC  TROPONIN T, HIGH SENSITIVITY  TROPONIN T, HIGH SENSITIVITY    EKG: None  Radiology: DG Chest 2 View Result Date: 02/03/2024 EXAM: 2 VIEW(S) XRAY OF THE  CHEST 02/03/2024 10:16:00 PM COMPARISON: None available. CLINICAL HISTORY: CP. Chest pain and heart palpitations over the last few hours. Denies cardiac hx. FINDINGS: LUNGS AND PLEURA: No focal pulmonary opacity. No pulmonary edema. No pleural effusion. No pneumothorax. HEART AND MEDIASTINUM: No acute abnormality of the cardiac and mediastinal silhouettes. BONES AND SOFT TISSUES: No acute osseous abnormality. IMPRESSION: 1. Normal chest radiograph. Electronically signed by: Norman Gatlin MD 02/03/2024 10:28 PM EDT RP Workstation: [id number]     Procedures   Medications Ordered in the ED  hydrOXYzine (ATARAX) tablet 25 mg (25 mg Oral Given 02/03/24 2353)                                    Medical Decision Making Amount and/or Complexity of Data Reviewed Labs: ordered. Radiology: ordered.  Risk Prescription  drug management.   This patient presents to the ED for concern of palpitations, this involves an extensive number of treatment options, and is a complaint that carries with it a high risk of complications and morbidity.  The differential diagnosis includes anxiety, ACS, dysrhythmia, pneumonia, PE   Co morbidities / Chronic conditions that complicate the patient evaluation  Reported history of anxiety   Additional history obtained:  Additional history obtained from EMR   Lab Tests:  I Ordered, and personally interpreted labs.  The pertinent results include: Troponin less than 15, unremarkable CBC and BMP   Imaging Studies ordered:  I ordered imaging studies including chest x-ray I independently visualized and interpreted imaging which showed no acute findings I agree with the radiologist interpretation   Cardiac Monitoring: / EKG:  The patient was maintained on a cardiac monitor.  I personally viewed and interpreted the cardiac monitored which showed an underlying rhythm of: Sinus tachycardia   Problem List / ED Course / Critical interventions / Medication management   I ordered medication including Atarax Reevaluation of the patient after these medicines showed that the patient improved I have reviewed the patients home medicines and have made adjustments as needed   Social Determinants of Health:  Patient is a smoker, currently has no PCP   Test / Admission - Considered:  Patient with a negative troponin and nonischemic EKG.  History not consistent with ACS.  No sign of ACS at this time.  PERC negative for PE.  Chest x-ray unremarkable.  Patient with significant improvement after Atarax.  Suspect anxiety as underlying cause at this time.  Plan to discharge home with return precautions.  Prescription for Atarax provided.  Patient planning to establish care with PCP for further management as needed.      Final diagnoses:  Chest pain, unspecified type  Anxiety     ED Discharge Orders          Ordered    hydrOXYzine (ATARAX) 25 MG tablet  Every 6 hours PRN        02/04/24 0205               Logan Ubaldo NOVAK, PA-C 02/04/24 0206    Carita Senior, MD 02/04/24 [phone]

## 2024-02-04 NOTE — Discharge Instructions (Signed)
 Your workup this evening was reassuring.  I have prescribed a short course of medication to help with anxiety at home.  Is important that you establish care with a primary care provider for further management of your symptoms.  If you have further chest pain or develop other life-threatening symptoms please return to the emergency department immediately for further evaluation.
